# Patient Record
Sex: Male | Born: 1975 | Race: White | Hispanic: No | Marital: Single | Smoking: Never smoker
Health system: Southern US, Community
[De-identification: ages and names within clinical notes are randomized; demographics above are authoritative.]

## PROBLEM LIST (undated history)

## (undated) DIAGNOSIS — T4145XA Adverse effect of unspecified anesthetic, initial encounter: Secondary | ICD-10-CM

## (undated) DIAGNOSIS — J189 Pneumonia, unspecified organism: Secondary | ICD-10-CM

## (undated) DIAGNOSIS — T8859XA Other complications of anesthesia, initial encounter: Secondary | ICD-10-CM

## (undated) DIAGNOSIS — F419 Anxiety disorder, unspecified: Secondary | ICD-10-CM

## (undated) DIAGNOSIS — Z9889 Other specified postprocedural states: Secondary | ICD-10-CM

## (undated) DIAGNOSIS — E119 Type 2 diabetes mellitus without complications: Secondary | ICD-10-CM

## (undated) DIAGNOSIS — R112 Nausea with vomiting, unspecified: Secondary | ICD-10-CM

## (undated) DIAGNOSIS — M1990 Unspecified osteoarthritis, unspecified site: Secondary | ICD-10-CM

## (undated) HISTORY — PX: APPENDECTOMY: SHX54

## (undated) HISTORY — PX: KNEE ARTHROSCOPY: SUR90

## (undated) HISTORY — PX: WISDOM TOOTH EXTRACTION: SHX21

---

## 2008-07-06 DIAGNOSIS — J189 Pneumonia, unspecified organism: Secondary | ICD-10-CM

## 2008-07-06 HISTORY — DX: Pneumonia, unspecified organism: J18.9

## 2013-10-17 ENCOUNTER — Encounter (HOSPITAL_COMMUNITY): Payer: Self-pay | Admitting: Pharmacy Technician

## 2013-10-20 ENCOUNTER — Other Ambulatory Visit (HOSPITAL_COMMUNITY): Payer: Self-pay | Admitting: *Deleted

## 2013-10-20 ENCOUNTER — Encounter (HOSPITAL_COMMUNITY)
Admission: RE | Admit: 2013-10-20 | Discharge: 2013-10-20 | Disposition: A | Discharge status: Home / Self Care | Payer: No Typology Code available for payment source | Source: Ambulatory Visit | Attending: Orthopedic Surgery | Admitting: Orthopedic Surgery

## 2013-10-20 ENCOUNTER — Encounter (HOSPITAL_COMMUNITY): Payer: Self-pay

## 2013-10-20 DIAGNOSIS — Z01812 Encounter for preprocedural laboratory examination: Secondary | ICD-10-CM | POA: Insufficient documentation

## 2013-10-20 HISTORY — DX: Adverse effect of unspecified anesthetic, initial encounter: T41.45XA

## 2013-10-20 HISTORY — DX: Pneumonia, unspecified organism: J18.9

## 2013-10-20 HISTORY — DX: Other specified postprocedural states: Z98.890

## 2013-10-20 HISTORY — DX: Nausea with vomiting, unspecified: R11.2

## 2013-10-20 HISTORY — DX: Other complications of anesthesia, initial encounter: T88.59XA

## 2013-10-20 HISTORY — DX: Unspecified osteoarthritis, unspecified site: M19.90

## 2013-10-20 HISTORY — DX: Anxiety disorder, unspecified: F41.9

## 2013-10-20 LAB — COMPREHENSIVE METABOLIC PANEL
ALK PHOS: 104 U/L (ref 39–117)
ALT: 62 U/L — ABNORMAL HIGH (ref 0–53)
AST: 43 U/L — AB (ref 0–37)
Albumin: 4 g/dL (ref 3.5–5.2)
BUN: 17 mg/dL (ref 6–23)
CHLORIDE: 105 meq/L (ref 96–112)
CO2: 22 meq/L (ref 19–32)
CREATININE: 1.04 mg/dL (ref 0.50–1.35)
Calcium: 9.3 mg/dL (ref 8.4–10.5)
GFR calc Af Amer: >90 mL/min (ref >90)
GFR, EST NON AFRICAN AMERICAN: 90 mL/min — AB (ref >90)
Glucose, Bld: 115 mg/dL — ABNORMAL HIGH (ref 70–99)
POTASSIUM: 4.4 meq/L (ref 3.7–5.3)
Sodium: 141 mEq/L (ref 137–147)
Total Bilirubin: 0.6 mg/dL (ref 0.3–1.2)
Total Protein: 7.2 g/dL (ref 6.0–8.3)

## 2013-10-20 LAB — CBC WITH DIFFERENTIAL/PLATELET
Basophils Absolute: 0 10*3/uL (ref 0.0–0.1)
Basophils Relative: 0 % (ref 0–1)
Eosinophils Absolute: 0.2 10*3/uL (ref 0.0–0.7)
Eosinophils Relative: 4 % (ref 0–5)
HEMATOCRIT: 49.1 % (ref 39.0–52.0)
HEMOGLOBIN: 16.8 g/dL (ref 13.0–17.0)
Lymphocytes Relative: 36 % (ref 12–46)
Lymphs Abs: 1.9 10*3/uL (ref 0.7–4.0)
MCH: 30.3 pg (ref 26.0–34.0)
MCHC: 34.2 g/dL (ref 30.0–36.0)
MCV: 88.6 fL (ref 78.0–100.0)
MONOS PCT: 5 % (ref 3–12)
Monocytes Absolute: 0.3 10*3/uL (ref 0.1–1.0)
NEUTROS ABS: 2.8 10*3/uL (ref 1.7–7.7)
NEUTROS PCT: 55 % (ref 43–77)
Platelets: 248 10*3/uL (ref 150–400)
RBC: 5.54 MIL/uL (ref 4.22–5.81)
RDW: 12.8 % (ref 11.5–15.5)
WBC: 5.2 10*3/uL (ref 4.0–10.5)

## 2013-10-20 LAB — PROTIME-INR
INR: 0.98 (ref 0.00–1.49)
Prothrombin Time: 12.8 seconds (ref 11.6–15.2)

## 2013-10-20 LAB — APTT: APTT: 28 s (ref 24–37)

## 2013-10-20 NOTE — Pre-Procedure Instructions (Signed)
Maurice Gaines  10/20/2013   Your procedure is scheduled on:  Thursday, October 26, 2013 at 10:00 AM.   Report to [facility] En[facility]" Admitting Office at 8:00 AM.   Call this number if you have problems the morning of surgery: [phone]   Remember:   Do not eat food or drink liquids after midnight Wednesday, 10/25/13.   Take these medicines the morning of surgery with A SIP OF WATER: None    Do not wear jewelry.  Do not wear lotions, powders, or cologne. You may NOT wear deodorant.  Men may shave face and neck.  Do not bring valuables to the hospital.  [facility] is not responsible                  for any belongings or valuables.               Contacts, dentures or bridgework may not be worn into surgery.  Leave suitcase in the car. After surgery it may be brought to your room.  For patients admitted to the hospital, discharge time is determined by your                treatment team.               Patients discharged the day of surgery will not be allowed to drive home.  Name and phone number of your driver: Family/friend   Special Instructions: [facility] - Preparing for Surgery  Before surgery, you can play an important role.  Because skin is not sterile, your skin needs to be as free of germs as possible.  You can reduce the number of germs on you skin by washing with CHG (chlorahexidine gluconate) soap before surgery.  CHG is an antiseptic cleaner which kills germs and bonds with the skin to continue killing germs even after washing.  Please DO NOT use if you have an allergy to CHG or antibacterial soaps.  If your skin becomes reddened/irritated stop using the CHG and inform your nurse when you arrive at Short Stay.  Do not shave (including legs and underarms) for at least 48 hours prior to the first CHG shower.  You may shave your face.  Please follow these instructions carefully:   1.  Shower with CHG Soap the night before surgery and the                                 morning of Surgery.  2.  If you choose to wash your hair, wash your hair first as usual with your       normal shampoo.  3.  After you shampoo, rinse your hair and body thoroughly to remove the                      Shampoo.  4.  Use CHG as you would any other liquid soap.  You can apply chg directly       to the skin and wash gently with scrungie or a clean washcloth.  5.  Apply the CHG Soap to your body ONLY FROM THE NECK DOWN.        Do not use on open wounds or open sores.  Avoid contact with your eyes, ears, mouth and genitals (private parts).  Wash genitals (private parts) with your normal soap.  6.  Wash thoroughly, paying special attention to the area  where your surgery        will be performed.  7.  Thoroughly rinse your body with warm water from the neck down.  8.  DO NOT shower/wash with your normal soap after using and rinsing off       the CHG Soap.  9.  Pat yourself dry with a clean towel.            10.  Wear clean pajamas.            11.  Place clean sheets on your bed the night of your first shower and do not        sleep with pets.  Day of Surgery  Do not apply any lotions/deodorants the morning of surgery.  Please wear clean clothes to the hospital/surgery center.     Please read over the following fact sheets that you were given: Pain Booklet, Coughing and Deep Breathing and Surgical Site Infection Prevention

## 2013-10-20 NOTE — Progress Notes (Signed)
Pt does not have a PCP. 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

## 2013-10-25 MED ORDER — CEFAZOLIN SODIUM-DEXTROSE 2-3 GM-% IV SOLR
2.0000 g | INTRAVENOUS | Status: AC
  Administered 2013-10-26: 2 g via INTRAVENOUS
  Filled 2013-10-25: qty 50

## 2013-10-25 MED ORDER — CHLORHEXIDINE GLUCONATE 4 % EX LIQD
60.0000 mL | Freq: Once | CUTANEOUS | Status: DC
  Filled 2013-10-25: qty 60

## 2013-10-25 MED ORDER — LACTATED RINGERS IV SOLN
INTRAVENOUS | Status: DC
  Administered 2013-10-26: 09:00:00 via INTRAVENOUS

## 2013-10-26 ENCOUNTER — Encounter (HOSPITAL_COMMUNITY)
Admission: RE | Disposition: A | Discharge status: Home / Self Care | Payer: Self-pay | Source: Ambulatory Visit | Attending: Orthopedic Surgery

## 2013-10-26 ENCOUNTER — Ambulatory Visit (HOSPITAL_COMMUNITY): Payer: No Typology Code available for payment source | Admitting: Anesthesiology

## 2013-10-26 ENCOUNTER — Encounter (HOSPITAL_COMMUNITY): Payer: No Typology Code available for payment source | Admitting: Anesthesiology

## 2013-10-26 ENCOUNTER — Ambulatory Visit (HOSPITAL_COMMUNITY)
Admission: RE | Admit: 2013-10-26 | Discharge: 2013-10-26 | Disposition: A | Discharge status: Home / Self Care | Payer: No Typology Code available for payment source | Source: Ambulatory Visit | Attending: Orthopedic Surgery | Admitting: Orthopedic Surgery

## 2013-10-26 ENCOUNTER — Encounter (HOSPITAL_COMMUNITY): Payer: Self-pay | Admitting: Anesthesiology

## 2013-10-26 DIAGNOSIS — M19019 Primary osteoarthritis, unspecified shoulder: Secondary | ICD-10-CM | POA: Insufficient documentation

## 2013-10-26 DIAGNOSIS — M25819 Other specified joint disorders, unspecified shoulder: Secondary | ICD-10-CM | POA: Insufficient documentation

## 2013-10-26 DIAGNOSIS — M7580 Other shoulder lesions, unspecified shoulder: Secondary | ICD-10-CM

## 2013-10-26 DIAGNOSIS — S43439A Superior glenoid labrum lesion of unspecified shoulder, initial encounter: Secondary | ICD-10-CM | POA: Insufficient documentation

## 2013-10-26 DIAGNOSIS — X58XXXS Exposure to other specified factors, sequela: Secondary | ICD-10-CM | POA: Insufficient documentation

## 2013-10-26 HISTORY — PX: SHOULDER ARTHROSCOPY: SHX128

## 2013-10-26 LAB — GLUCOSE, CAPILLARY: GLUCOSE-CAPILLARY: 116 mg/dL — AB (ref 70–99)

## 2013-10-26 SURGERY — ARTHROSCOPY, SHOULDER
Anesthesia: Regional | Site: Shoulder | Laterality: Right

## 2013-10-26 MED ORDER — GLYCOPYRROLATE 0.2 MG/ML IJ SOLN
INTRAMUSCULAR | Status: AC
  Filled 2013-10-26: qty 2

## 2013-10-26 MED ORDER — NEOSTIGMINE METHYLSULFATE 1 MG/ML IJ SOLN
INTRAMUSCULAR | Status: AC
  Filled 2013-10-26: qty 10

## 2013-10-26 MED ORDER — LIDOCAINE HCL (CARDIAC) 20 MG/ML IV SOLN
INTRAVENOUS | Status: AC
  Filled 2013-10-26: qty 5

## 2013-10-26 MED ORDER — ONDANSETRON HCL 4 MG PO TABS: 4.0000 mg | ORAL_TABLET | Freq: Three times a day (TID) | ORAL | Status: AC | PRN

## 2013-10-26 MED ORDER — MIDAZOLAM HCL 2 MG/2ML IJ SOLN
1.0000 mg | INTRAMUSCULAR | Status: DC | PRN
  Administered 2013-10-26: 2 mg via INTRAVENOUS

## 2013-10-26 MED ORDER — ROCURONIUM BROMIDE 50 MG/5ML IV SOLN
INTRAVENOUS | Status: AC
  Filled 2013-10-26: qty 1

## 2013-10-26 MED ORDER — ROCURONIUM BROMIDE 100 MG/10ML IV SOLN
INTRAVENOUS | Status: DC | PRN
  Administered 2013-10-26: 50 mg via INTRAVENOUS

## 2013-10-26 MED ORDER — FENTANYL CITRATE 0.05 MG/ML IJ SOLN: 25.0000 ug | INTRAMUSCULAR | Status: DC | PRN

## 2013-10-26 MED ORDER — SCOPOLAMINE 1 MG/3DAYS TD PT72
MEDICATED_PATCH | TRANSDERMAL | Status: AC
  Filled 2013-10-26: qty 1

## 2013-10-26 MED ORDER — ACETAMINOPHEN 160 MG/5ML PO SOLN
325.0000 mg | ORAL | Status: DC | PRN
  Filled 2013-10-26: qty 20.3

## 2013-10-26 MED ORDER — GLYCOPYRROLATE 0.2 MG/ML IJ SOLN
INTRAMUSCULAR | Status: DC | PRN
  Administered 2013-10-26: 0.4 mg via INTRAVENOUS

## 2013-10-26 MED ORDER — FENTANYL CITRATE 0.05 MG/ML IJ SOLN
INTRAMUSCULAR | Status: AC
  Administered 2013-10-26: 50 ug via INTRAVENOUS
  Filled 2013-10-26: qty 2

## 2013-10-26 MED ORDER — MIDAZOLAM HCL 2 MG/2ML IJ SOLN
INTRAMUSCULAR | Status: AC
  Administered 2013-10-26: 2 mg via INTRAVENOUS
  Filled 2013-10-26: qty 2

## 2013-10-26 MED ORDER — NAPROXEN 500 MG PO TABS: 500.0000 mg | ORAL_TABLET | Freq: Two times a day (BID) | ORAL | Status: AC

## 2013-10-26 MED ORDER — PROPOFOL 10 MG/ML IV BOLUS
INTRAVENOUS | Status: AC
  Filled 2013-10-26: qty 20

## 2013-10-26 MED ORDER — ONDANSETRON HCL 4 MG/2ML IJ SOLN
INTRAMUSCULAR | Status: AC
  Filled 2013-10-26: qty 2

## 2013-10-26 MED ORDER — 0.9 % SODIUM CHLORIDE (POUR BTL) OPTIME
TOPICAL | Status: DC | PRN
  Administered 2013-10-26: 1000 mL

## 2013-10-26 MED ORDER — PROMETHAZINE HCL 25 MG/ML IJ SOLN
6.2500 mg | INTRAMUSCULAR | Status: DC | PRN
  Administered 2013-10-26: 6.25 mg via INTRAVENOUS

## 2013-10-26 MED ORDER — DIAZEPAM 5 MG PO TABS: 2.5000 mg | ORAL_TABLET | Freq: Four times a day (QID) | ORAL | Status: AC | PRN

## 2013-10-26 MED ORDER — OXYCODONE HCL 5 MG PO TABS: 5.0000 mg | ORAL_TABLET | Freq: Once | ORAL | Status: DC | PRN

## 2013-10-26 MED ORDER — FENTANYL CITRATE 0.05 MG/ML IJ SOLN
INTRAMUSCULAR | Status: AC
  Filled 2013-10-26: qty 5

## 2013-10-26 MED ORDER — OXYCODONE-ACETAMINOPHEN 5-325 MG PO TABS: 1.0000 | ORAL_TABLET | ORAL | Status: AC | PRN

## 2013-10-26 MED ORDER — ACETAMINOPHEN 325 MG PO TABS: 325.0000 mg | ORAL_TABLET | ORAL | Status: DC | PRN

## 2013-10-26 MED ORDER — OXYCODONE HCL 5 MG/5ML PO SOLN: 5.0000 mg | Freq: Once | ORAL | Status: DC | PRN

## 2013-10-26 MED ORDER — ONDANSETRON HCL 4 MG/2ML IJ SOLN
INTRAMUSCULAR | Status: DC | PRN
  Administered 2013-10-26: 4 mg via INTRAVENOUS

## 2013-10-26 MED ORDER — NEOSTIGMINE METHYLSULFATE 1 MG/ML IJ SOLN
INTRAMUSCULAR | Status: DC | PRN
  Administered 2013-10-26: 3 mg via INTRAVENOUS

## 2013-10-26 MED ORDER — SCOPOLAMINE 1 MG/3DAYS TD PT72
MEDICATED_PATCH | TRANSDERMAL | Status: DC | PRN
  Administered 2013-10-26: 1 via TRANSDERMAL

## 2013-10-26 MED ORDER — SODIUM CHLORIDE 0.9 % IR SOLN
Status: DC | PRN
  Administered 2013-10-26 (×2): 3000 mL

## 2013-10-26 MED ORDER — ROPIVACAINE HCL 5 MG/ML IJ SOLN
INTRAMUSCULAR | Status: DC | PRN
Start: 2013-10-26 — End: 2013-10-26
  Administered 2013-10-26: 30 mL via PERINEURAL

## 2013-10-26 MED ORDER — PROPOFOL 10 MG/ML IV BOLUS
INTRAVENOUS | Status: DC | PRN
  Administered 2013-10-26: 200 mg via INTRAVENOUS
  Administered 2013-10-26: 100 mg via INTRAVENOUS

## 2013-10-26 MED ORDER — FENTANYL CITRATE 0.05 MG/ML IJ SOLN
50.0000 ug | INTRAMUSCULAR | Status: DC | PRN
  Administered 2013-10-26: 50 ug via INTRAVENOUS

## 2013-10-26 MED ORDER — MIDAZOLAM HCL 2 MG/2ML IJ SOLN
INTRAMUSCULAR | Status: AC
  Filled 2013-10-26: qty 2

## 2013-10-26 MED ORDER — LIDOCAINE HCL (CARDIAC) 20 MG/ML IV SOLN
INTRAVENOUS | Status: DC | PRN
  Administered 2013-10-26: 80 mg via INTRAVENOUS

## 2013-10-26 MED ORDER — LACTATED RINGERS IV SOLN
INTRAVENOUS | Status: DC | PRN
  Administered 2013-10-26 (×2): via INTRAVENOUS

## 2013-10-26 MED ORDER — PROMETHAZINE HCL 25 MG/ML IJ SOLN
INTRAMUSCULAR | Status: AC
  Filled 2013-10-26: qty 1

## 2013-10-26 SURGICAL SUPPLY — 71 items
ANCHOR SUT SWIVELLOK BIO (Anchor) ×3 IMPLANT
BLADE CUTTER GATOR 3.5 (BLADE) ×3 IMPLANT
BLADE GREAT WHITE 4.2 (BLADE) ×2 IMPLANT
BLADE GREAT WHITE 4.2MM (BLADE) ×1
BLADE SURG 11 STRL SS (BLADE) ×3 IMPLANT
BOOTCOVER CLEANROOM LRG (PROTECTIVE WEAR) ×6 IMPLANT
BUR 3.5 LG SPHERICAL (BURR) IMPLANT
BUR OVAL 4.0 (BURR) ×3 IMPLANT
BURR 3.5 LG SPHERICAL (BURR)
BURR 3.5MM LG SPHERICAL (BURR)
CANISTER SUCT LVC 12 LTR MEDI- (MISCELLANEOUS) ×3 IMPLANT
CANNULA ACUFLEX KIT 5X76 (CANNULA) ×6 IMPLANT
CANNULA DRILOCK 5.0MMX75MM (CANNULA)
CANNULA DRILOCK 5.0X75 (CANNULA) IMPLANT
CLOSURE STERI-STRIP 1/2X4 (GAUZE/BANDAGES/DRESSINGS) ×1
CLOSURE WOUND 1/2 X4 (GAUZE/BANDAGES/DRESSINGS) ×1
CLSR STERI-STRIP ANTIMIC 1/2X4 (GAUZE/BANDAGES/DRESSINGS) ×2 IMPLANT
CONNECTOR 5 IN 1 STRAIGHT STRL (MISCELLANEOUS) ×3 IMPLANT
DRAPE INCISE 23X17 IOBAN STRL (DRAPES) ×2
DRAPE INCISE IOBAN 23X17 STRL (DRAPES) ×1 IMPLANT
DRAPE INCISE IOBAN 66X45 STRL (DRAPES) ×3 IMPLANT
DRAPE STERI 35X30 U-POUCH (DRAPES) ×3 IMPLANT
DRAPE SURG 17X11 SM STRL (DRAPES) ×3 IMPLANT
DRAPE U-SHAPE 47X51 STRL (DRAPES) ×3 IMPLANT
DRSG MEPILEX BORDER 4X4 (GAUZE/BANDAGES/DRESSINGS) ×3 IMPLANT
DRSG PAD ABDOMINAL 8X10 ST (GAUZE/BANDAGES/DRESSINGS) ×6 IMPLANT
DURAPREP 26ML APPLICATOR (WOUND CARE) ×3 IMPLANT
ELECT REM PT RETURN 9FT ADLT (ELECTROSURGICAL) ×3
ELECTRODE REM PT RTRN 9FT ADLT (ELECTROSURGICAL) ×1 IMPLANT
GLOVE BIO SURGEON STRL SZ7 (GLOVE) ×3 IMPLANT
GLOVE BIO SURGEON STRL SZ7.5 (GLOVE) ×3 IMPLANT
GLOVE BIO SURGEON STRL SZ8 (GLOVE) ×3 IMPLANT
GLOVE BIOGEL PI IND STRL 7.0 (GLOVE) ×1 IMPLANT
GLOVE BIOGEL PI INDICATOR 7.0 (GLOVE) ×2
GLOVE EUDERMIC 7 POWDERFREE (GLOVE) ×3 IMPLANT
GLOVE SS BIOGEL STRL SZ 7.5 (GLOVE) ×1 IMPLANT
GLOVE SUPERSENSE BIOGEL SZ 7.5 (GLOVE) ×2
GOWN STRL REUS W/ TWL LRG LVL3 (GOWN DISPOSABLE) ×2 IMPLANT
GOWN STRL REUS W/ TWL XL LVL3 (GOWN DISPOSABLE) ×4 IMPLANT
GOWN STRL REUS W/TWL LRG LVL3 (GOWN DISPOSABLE) ×4
GOWN STRL REUS W/TWL XL LVL3 (GOWN DISPOSABLE) ×8
KIT BASIN OR (CUSTOM PROCEDURE TRAY) ×3 IMPLANT
KIT BIO-TENODESIS 3X8 DISP (MISCELLANEOUS) ×2
KIT INSRT BABSR STRL DISP BTN (MISCELLANEOUS) ×1 IMPLANT
KIT ROOM TURNOVER OR (KITS) ×3 IMPLANT
KIT SHOULDER TRACTION (DRAPES) ×3 IMPLANT
MANIFOLD NEPTUNE II (INSTRUMENTS) ×3 IMPLANT
NDL SUT 6 .5 CRC .975X.05 MAYO (NEEDLE) IMPLANT
NEEDLE MAYO TAPER (NEEDLE)
NEEDLE SPNL 18GX3.5 QUINCKE PK (NEEDLE) ×3 IMPLANT
NS IRRIG 1000ML POUR BTL (IV SOLUTION) ×3 IMPLANT
PACK SHOULDER (CUSTOM PROCEDURE TRAY) ×3 IMPLANT
PAD ABD 8X10 STRL (GAUZE/BANDAGES/DRESSINGS) ×3 IMPLANT
PAD ARMBOARD 7.5X6 YLW CONV (MISCELLANEOUS) ×6 IMPLANT
SET ARTHROSCOPY TUBING (MISCELLANEOUS) ×2
SET ARTHROSCOPY TUBING LN (MISCELLANEOUS) ×1 IMPLANT
SLING ARM LRG ADULT FOAM STRAP (SOFTGOODS) IMPLANT
SLING ARM MED ADULT FOAM STRAP (SOFTGOODS) ×3 IMPLANT
SPONGE GAUZE 4X4 12PLY (GAUZE/BANDAGES/DRESSINGS) ×3 IMPLANT
SPONGE LAP 4X18 X RAY DECT (DISPOSABLE) ×3 IMPLANT
STRIP CLOSURE SKIN 1/2X4 (GAUZE/BANDAGES/DRESSINGS) ×2 IMPLANT
SUT MNCRL AB 3-0 PS2 18 (SUTURE) ×3 IMPLANT
SUT PDS AB 0 CT 36 (SUTURE) IMPLANT
SUT RETRIEVER GRASP 30 DEG (SUTURE) IMPLANT
SYR 20CC LL (SYRINGE) ×3 IMPLANT
TAPE PAPER 3X10 WHT MICROPORE (GAUZE/BANDAGES/DRESSINGS) ×3 IMPLANT
TOWEL OR 17X24 6PK STRL BLUE (TOWEL DISPOSABLE) ×3 IMPLANT
TOWEL OR 17X26 10 PK STRL BLUE (TOWEL DISPOSABLE) ×3 IMPLANT
WAND SUCTION MAX 4MM 90S (SURGICAL WAND) ×3 IMPLANT
WATER STERILE IRR 1000ML POUR (IV SOLUTION) ×3 IMPLANT
YANKAUER SUCT BULB TIP NO VENT (SUCTIONS) ×3 IMPLANT

## 2013-10-26 NOTE — H&P (Signed)
Maurice Gaines    Chief Complaint: RIGHT SOULDER SLAP TEAR HPI: The patient is a 38 y.o. male with chronic right shoulder pain refractory to conservative management  Past Medical History  Diagnosis Date  . Pneumonia 2010  . Anxiety     in crowds, never treated  . Arthritis     right shoulder  . Complication of anesthesia   . PONV (postoperative nausea and vomiting)     some nausea, no vomiting    Past Surgical History  Procedure Laterality Date  . Appendectomy    . Knee arthroscopy      x 2 (1st one was partial tear of meniscus) (2nd one complete tear)  . Wisdom tooth extraction      Family History  Problem Relation Age of Onset  . Other Mother   . Glaucoma Father     Social History:  reports that he has never smoked. He has never used smokeless tobacco. He reports that he drinks alcohol. He reports that he does not use illicit drugs.  Allergies: No Known Allergies  No prescriptions prior to admission     Physical Exam: right shoulder with painful and restricted motion as noted at recent office visits.  Vitals  Temp:  [97.6 F (36.4 C)] 97.6 F (36.4 C) (04/23 0750) Pulse Rate:  [59-74] 73 (04/23 0952) Resp:  [11-20] 20 (04/23 0952) BP: (115-144)/(80-98) 133/98 mmHg (04/23 0952) SpO2:  [98 %-100 %] 100 % (04/23 0952) Weight:  [87.091 kg (192 lb)] 87.091 kg (192 lb) (04/23 0751)  Assessment/Plan  Impression: RIGHT SOULDER SLAP TEAR  Plan of Action: Procedure(s): RIGHT ARTHROSCOPY SHOULDER DEBRIDEMENT VS REPAIR SUBACROMIAL DECOMPRESSION DISTAL CLAVICAL RESECTION POSSIBLE BICEP TENDODESIS  Maurice Gaines 10/26/2013, 9:57 AM

## 2013-10-26 NOTE — Anesthesia Postprocedure Evaluation (Signed)
  Anesthesia Post-op Note  Patient: Maurice Gaines  Procedure(s) Performed: Procedure(s): RIGHT ARTHROSCOPY SHOULDER DEBRIDEMENT VS REPAIR SUBACROMIAL DECOMPRESSION DISTAL CLAVICAL RESECTION POSSIBLE BICEP TENDODESIS (Right)  Patient Location: PACU  Anesthesia Type:General and Regional  Level of Consciousness: awake and alert   Airway and Oxygen Therapy: Patient Spontanous Breathing  Post-op Pain: none  Post-op Assessment: Post-op Vital signs reviewed, Patient's Cardiovascular Status Stable, Respiratory Function Stable, Patent Airway, No signs of Nausea or vomiting and Pain level controlled  Post-op Vital Signs: Reviewed and stable  Last Vitals:  Filed Vitals:   10/26/13 1418  BP: 113/73  Pulse: 49  Temp:   Resp: 15    Complications: No apparent anesthesia complications

## 2013-10-26 NOTE — Progress Notes (Signed)
Pt remains slightly pale but reports that he is feeling better.

## 2013-10-26 NOTE — Op Note (Signed)
10/26/2013  12:35 PM  PATIENT:   Ezzie DuralAnthony Brendlinger  38 y.o. male  PRE-OPERATIVE DIAGNOSIS:  RIGHT SOULDER SLAP TEAR,IMPINGMENT, AC JOINT OA  POST-OPERATIVE DIAGNOSIS:  same  PROCEDURE:  RSA, SAD, DCR, open supra pectoral bicep tenodesis  SURGEON:  Ellamarie Naeve, Vania ReaKevin M. M.D.  ASSISTANTS: Shuford pac   ANESTHESIA:   GET + ISB  EBL: min  SPECIMEN:  none  Drains: none   PATIENT DISPOSITION:  PACU - hemodynamically stable.    PLAN OF CARE: Discharge to home after PACU  Dictation# [id number]

## 2013-10-26 NOTE — Discharge Instructions (Signed)
° °  Maurice ReaKevin M. Supple, M.D., F.A.A.O.S. Orthopaedic Surgery Specializing in Arthroscopic and Reconstructive Surgery of the Shoulder and Knee [phone] [address] [place], [place] [id number] - Fax [phone]   POST-OP SHOULDER ARTHROSCOPY INSTRUCTIONS  1. Call the office at [phone] to schedule your first post-op appointment 7-10 days from the date of your surgery.  2. Leave the steri-strips in place over your incisions when performing dressing changes and showering. You may remove your dressings and begin showering 72 hours from surgery. You can expect drainage that is clear to bloody in nature that occasionally will soak through your dressings. If this occurs go ahead and perform a dressing change. The drainage should lessen daily and when there is no drainage from your incisions feel free to go without a dressing.  3. Wear your sling for comfort. You may come out of your sling for ad lib activity and even decide not to use the sling at all. If you find you are more comfortable in your sling, make sure you come out of your sling at least 3-4 times a day to do the exercises that are included below.  4. Range of motion to your elbow is ok in flexion but no full extension (limit from 45-90),ok to move wrist, and hand and is encouraged 3-5 times daily. Exercise to your hand and fingers helps to reduce swelling you may experience.  5. Utilize ice to the shoulder 3-4 times minimum a day and additionally if you are experiencing pain.  6. You may drive when safely off narcotics and muscle relaxants.  7. If you had a block pre-operatively to provide post-op pain relief you may want to go ahead and begin utilizing your pain meds as your arm begins to wake up. Blocks can sometimes last up to 16-18 hours. If you are still pain-free prior to going to bed you may want to strongly consider taking a pain medication to avoid being awakened in the night with the onset of pain. A muscle  relaxant is also provided for you should you experience muscle spasms. It is recommended that if you are experiencing pain that your pain medication alone is not controlling, add the muscle relaxant along with the pain medication which can give additional pain relief. The first one to two days is generally the most severe of your pain and then should gradually decrease. As your pain lessens it is recommended that you decrease your use of the pain medications to an "as needed basis" only and to always comply with the recommended dosages of the pain medications.  8. Pain medications can produce constipation along with their use. If you experience this, the use of an over the counter stool softener or laxative daily is recommended.   9. For additional questions or concerns, please do not hesitate to call the office. If after hours there is an answering service to forward your concerns to the physician on call.   POST-OP EXERCISES   *****PERFORM PENDULUMS WITH ELBOW BENT!!!!!*******  The pendulum exercises should be performed while bending at the waist as far over as possible thereby letting gravity do the work for you.  Range of Motion Exercises: Pendulum (circular)  Repeat 20 times. Do 3 sessions per day.     Range of Motion Exercises: Pendulum (side-to-side)  Repeat 20 times. Do 3 sessions per day.

## 2013-10-26 NOTE — Transfer of Care (Signed)
Immediate Anesthesia Transfer of Care Note  Patient: Maurice Gaines  Procedure(s) Performed: Procedure(s): RIGHT ARTHROSCOPY SHOULDER DEBRIDEMENT VS REPAIR SUBACROMIAL DECOMPRESSION DISTAL CLAVICAL RESECTION POSSIBLE BICEP TENDODESIS (Right)  Patient Location: PACU  Anesthesia Type:GA combined with regional for post-op pain  Level of Consciousness: awake, alert , oriented and patient cooperative  Airway & Oxygen Therapy: Patient Spontanous Breathing and Patient connected to nasal cannula oxygen  Post-op Assessment: Report given to PACU RN and Post -op Vital signs reviewed and stable  Post vital signs: Reviewed and stable  Complications: No apparent anesthesia complications

## 2013-10-26 NOTE — Anesthesia Procedure Notes (Addendum)
Anesthesia Regional Block:  Interscalene brachial plexus block  Pre-Anesthetic Checklist: ,, timeout performed, Correct Patient, Correct Site, Correct Laterality, Correct Procedure, Correct Position, site marked, Risks and benefits discussed,  Surgical consent,  Pre-op evaluation,  At surgeon's request and post-op pain management  Laterality: Right and Upper  Prep: chloraprep       Needles:  Injection technique: Single-shot  Needle Type: Echogenic Stimulator Needle          Additional Needles:  Procedures: ultrasound guided (picture in chart) and nerve stimulator Interscalene brachial plexus block  Nerve Stimulator or Paresthesia:  Response: deltoid, 0.4 mA,   Additional Responses:   Narrative:  Start time: 10/26/2013 9:23 AM End time: 10/26/2013 9:28 AM Injection made incrementally with aspirations every 5 mL.  Performed by: Personally  Anesthesiologist: Tahlor Berenguer  Additional Notes: H+P and labs reviewed, risks and benefits discussed with patient, procedure tolerated well without complications

## 2013-10-26 NOTE — Anesthesia Preprocedure Evaluation (Signed)
Anesthesia Evaluation  Patient identified by MRN, date of birth, ID band Patient awake    Reviewed: Allergy & Precautions, H&P , NPO status , Patient's Chart, lab work & pertinent test results  History of Anesthesia Complications (+) PONV and history of anesthetic complications  Airway Mallampati: II TM Distance: >3 FB Neck ROM: Full    Dental  (+) Teeth Intact,    Pulmonary neg sleep apnea, neg COPD breath sounds clear to auscultation        Cardiovascular negative cardio ROS  Rhythm:Regular     Neuro/Psych negative neurological ROS  negative psych ROS   GI/Hepatic negative GI ROS, Neg liver ROS,   Endo/Other  negative endocrine ROS  Renal/GU negative Renal ROS     Musculoskeletal   Abdominal   Peds  Hematology negative hematology ROS (+)   Anesthesia Other Findings   Reproductive/Obstetrics                           Anesthesia Physical Anesthesia Plan  ASA: I  Anesthesia Plan: General and Regional   Post-op Pain Management:    Induction: Intravenous  Airway Management Planned: Oral ETT  Additional Equipment: None  Intra-op Plan:   Post-operative Plan: Extubation in OR  Informed Consent: I have reviewed the patients History and Physical, chart, labs and discussed the procedure including the risks, benefits and alternatives for the proposed anesthesia with the patient or authorized representative who has indicated his/her understanding and acceptance.   Dental advisory given  Plan Discussed with: CRNA and Surgeon  Anesthesia Plan Comments:         Anesthesia Quick Evaluation

## 2013-10-26 NOTE — Progress Notes (Addendum)
Pt feeling light headed and nauseous, no emesis. Cool cloths placed on head and neck with some improvement, VSS, will monitor.

## 2013-10-27 NOTE — Op Note (Signed)
NAME:  Maurice Gaines, Maurice Gaines             ACCOUNT NO.:  [id number]  MEDICAL RECORD NO.:  [id number]  LOCATION:                               FACILITY:  [facility]  PHYSICIAN:  Vania ReaKevin M. Marg Macmaster, M.D.  DATE OF BIRTH:  12-May-1976  DATE OF PROCEDURE:  10/26/2013 DATE OF DISCHARGE:  10/26/2013                              OPERATIVE REPORT   PREOPERATIVE DIAGNOSIS:  Chronic right shoulder pain with impingement syndrome, as well as clinical and MRI evidence for both a superior labral tear with paralabral cyst as well as symptomatic acromioclavicular joint arthropathy.  POSTOPERATIVE DIAGNOSES: 1. Chronic right shoulder impingement. 2. 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 3. 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  PROCEDURE: 1. Right shoulder examination under anesthesia. 2. Right shoulder glenoid joint diagnostic arthroscopy. 3. Debridement of superior as well as posterior labral tears and     decompression of associated paralabral cyst. 4. Arthroscopic subacromial decompression bursectomy. 5. Arthroscopic distal clavicle resection. 6. Open suprapectoral biceps tendon tenodesis.  SURGEON:  Vania ReaKevin M. Morgin Halls, M.D.  ASSISTANArbutus Ped:  Trucy A. Shuford, PA-C.  ANESTHESIA:  General endotracheal as well as an interscalene block.  ESTIMATED BLOOD LOSS:  Minimal.  DRAINS:  None.  HISTORY:  Mr. Maurice Gaines is a 38 year old gentleman who has had persistent right shoulder pain after a sports-related injury.  He has had ongoing discomfort with heavy use of the right upper extremity and has a clinical examination suggestive of superior labral/proximal biceps pathology.  His plain radiographs [facility] and MRI scan shows evidence for a superior labral tear and paralabral cyst, consistent with an unstable superior labral injury.  Due to his ongoing pain and functional limitations, he  is brought to the operating room at this time for planned right shoulder arthroscopy as described below.  Preoperatively, I counseled Mr. Maurice Gaines on treatment options as well as risks versus benefits thereof.  Possible surgical complications were all reviewed including potential for bleeding, infection, neurovascular injury, persistent pain, loss of motion, anesthetic complication, and possible need for additional surgery.  He understands and accepts and agrees with our planned procedure.  PROCEDURE IN DETAIL:  After undergoing routine preop evaluation, patient received prophylactic antibiotics.  An interscalene block was established in the holding area by the Anesthesia Department.  Placed supine on the operating table, underwent smooth induction of a general endotracheal anesthesia.  Turned to a left lateral decubitus position on a beanbag and appropriately padded and protected.  The right shoulder examination under anesthesia revealed full motion.  No instability patterns noted.  Right arm was then suspended at 70 degrees abduction with 10 pounds of traction.  The right shoulder girdle region was sterilely prepped and draped in standard fashion.  Time-out was called. A posterior portal was established into the glenohumeral joint and an anterior portal was established under direct visualization.  Articular surfaces were found to be in good condition.  No instability patterns noted.  The rotator cuff was carefully inspected and found to be intact with no fraying or fibrillations.  The labrum showed an obvious large and unstable tear of the superior labrum  with marked instability to biceps anchor.  Given these findings, we performed a biceps tenotomy for later tenodesis.  We then used a combination of shaver and Stryker wand to remove the unstable aspect of the superior labrum back to stable margin and the labral tear was also noted to extend posteriorly and these areas were all  debrided in such a way as to decompress the associated paralabral cyst.  Once this was completed, hemostasis was obtained.  Fluid and instruments were removed.  The arm was dropped down to 30 degrees abduction with the arthroscope introduced into the subacromial space through the posterior portal and a direct lateral portal was established in the subacromial space.  Abundant dense bursal tissue and multiple adhesions were encountered and these were all divided and excised with combination of the shaver and Stryker wand. The wand was then used to remove the periosteum from the undersurface of the anterior half of the acromion.  The subacromial decompression was performed with a bur creating type 1 morphology.  Portal was then established directly anterior to the distal clavicle and distal clavicle resection was performed with a bur.  Care was taken to confirm visualization of the entire circumference of the distal clavicle to ensure adequate removal of bone.  We then completed the subacromial/subdeltoid bursectomy and hemostasis was obtained.  The bursal surface of rotator cuff was found to be intact.  At this point, fluid and instrument were removed.  At this point, the arthroscopic portion of the case was completed and made a 3-cm incision over the anterior aspect of the proximal upper arm over the distal aspect of the deltopectoral interval.  Skin flaps were elevated.  Dissection carried deeply.  The cephalic vein was identified and retracted laterally with the deltoid and the pec major was then mobilized and retracted medially and dissection carried down distally such that we were just above the pec major insertion and this allowed [place] to gain access to the anterior cortex of the humerus and the fascial plane overlying the long head biceps tendon.  This was split longitudinally to allow [place] to retrieve the long head of the biceps tendon.  We then passed a whipstitch through the biceps  tendon at the appropriate level while tensioning the biceps for the biceps tenodesis.  The residual proximal segment of the biceps was then excised.  A guide pin was then placed into the anterior cortex of the proximal humerus at the level of bicipital groove, and introduced unicortically and this was overdrilled with a 6.5 mm reamer.  We then used a 7 mm tap into the tunnel.  Used a 6.25 [employer] tenodesis screw, and this was used to then deliver the biceps tendon into the bone tunnel and the interference screw was then inserted with excellent fit and fixation.  The overlying suture limbs were then tied to one another to reinforce the repair and overall construct was much to our satisfaction with good fixation.  The wounds were irrigated.  Hemostasis was obtained.  The deltopectoral interval allowed to reapproximate.  A 2- 0 Vicryl used for the subcu layer and intracuticular 3-0 Monocryl for the skin followed by Dermabond.  The arthroscopy portals were closed with Steri-Strips.  Dry dressing taped over the right shoulder and upper arm.  The right arm was placed into a sling.  The patient was awakened, extubated, and taken to the recovery room in stable condition.  Ralene Bathe, PA-C was used an Geophysicist/field seismologist throughout this case, essential for help with positioning  of the patient, positioning of the extremity, management of the arthroscopic equipment, tissue manipulation, suture management, retraction, wound closure, and intraoperative decision making.     Vania ReaKevin M. Keino Placencia, M.D.     KMS/MEDQ  D:  10/26/2013  T:  10/27/2013  Job:  [id number]

## 2013-10-31 ENCOUNTER — Encounter (HOSPITAL_COMMUNITY): Payer: Self-pay | Admitting: Orthopedic Surgery

## 2019-07-06 ENCOUNTER — Emergency Department (HOSPITAL_COMMUNITY)
Admission: EM | Admit: 2019-07-06 | Discharge: 2019-07-06 | Disposition: A | Discharge status: Home / Self Care | Payer: No Typology Code available for payment source | Attending: Emergency Medicine | Admitting: Emergency Medicine

## 2019-07-06 ENCOUNTER — Emergency Department (HOSPITAL_COMMUNITY): Payer: Self-pay

## 2019-07-06 ENCOUNTER — Other Ambulatory Visit: Payer: Self-pay

## 2019-07-06 ENCOUNTER — Encounter (HOSPITAL_COMMUNITY): Payer: Self-pay | Admitting: Emergency Medicine

## 2019-07-06 DIAGNOSIS — Y92312 Tennis court as the place of occurrence of the external cause: Secondary | ICD-10-CM | POA: Insufficient documentation

## 2019-07-06 DIAGNOSIS — S82892A Other fracture of left lower leg, initial encounter for closed fracture: Secondary | ICD-10-CM

## 2019-07-06 DIAGNOSIS — Y999 Unspecified external cause status: Secondary | ICD-10-CM | POA: Insufficient documentation

## 2019-07-06 DIAGNOSIS — Y9373 Activity, racquet and hand sports: Secondary | ICD-10-CM | POA: Insufficient documentation

## 2019-07-06 DIAGNOSIS — S82442A Displaced spiral fracture of shaft of left fibula, initial encounter for closed fracture: Secondary | ICD-10-CM | POA: Insufficient documentation

## 2019-07-06 DIAGNOSIS — X501XXA Overexertion from prolonged static or awkward postures, initial encounter: Secondary | ICD-10-CM | POA: Insufficient documentation

## 2019-07-06 MED ORDER — FENTANYL CITRATE (PF) 100 MCG/2ML IJ SOLN
50.0000 ug | Freq: Once | INTRAMUSCULAR | Status: AC
  Administered 2019-07-06: 50 ug via INTRAVENOUS

## 2019-07-06 MED ORDER — LIDOCAINE-EPINEPHRINE (PF) 2 %-1:200000 IJ SOLN
INTRAMUSCULAR | Status: AC
  Filled 2019-07-06: qty 20

## 2019-07-06 MED ORDER — ONDANSETRON 4 MG PO TBDP: ORAL_TABLET | ORAL | 0 refills | Status: AC

## 2019-07-06 MED ORDER — LIDOCAINE-EPINEPHRINE (PF) 2 %-1:200000 IJ SOLN
10.0000 mL | Freq: Once | INTRAMUSCULAR | Status: AC
  Administered 2019-07-06: 10 mL via INTRADERMAL

## 2019-07-06 MED ORDER — FENTANYL CITRATE (PF) 100 MCG/2ML IJ SOLN
100.0000 ug | Freq: Once | INTRAMUSCULAR | Status: AC
  Administered 2019-07-06: 100 ug via INTRAVENOUS
  Filled 2019-07-06: qty 2

## 2019-07-06 MED ORDER — FENTANYL CITRATE (PF) 100 MCG/2ML IJ SOLN
INTRAMUSCULAR | Status: AC
  Filled 2019-07-06: qty 2

## 2019-07-06 MED ORDER — MORPHINE SULFATE 15 MG PO TABS: 15.0000 mg | ORAL_TABLET | ORAL | 0 refills | Status: AC | PRN

## 2019-07-06 NOTE — Discharge Instructions (Signed)

## 2019-07-06 NOTE — ED Triage Notes (Signed)
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 V/s on arrival 140/92, hr78, rr10, spo2 98 room air. Iv 18 LAC, 100 mcg fentyle and 4 zofran.  Pt is a physical therapist.

## 2019-07-06 NOTE — ED Provider Notes (Signed)
[facility]-EMERGENCY DEPT Provider Note   CSN: [id number] Arrival date & time: 07/06/19  1947     History Chief Complaint  Patient presents with  . Fall    Maurice Gaines is a 43 y.o. male.  43 yo M with a chief complaints of left ankle pain.  The patient was playing tennis and he fell and had an inversion injury to the left ankle.  Felt a pop.  Initially thought that he had ruptured his ACL, he has done this in the past.  And realized that he had pain and deformity to the left ankle.  He was able to straighten it out.  Denies any pain to the foot or the knee.  Denies other injury.  The history is provided by the patient.  Fall This is a new problem. The current episode started yesterday. The problem occurs constantly. The problem has not changed since onset.Pertinent negatives include no chest pain, no abdominal pain, no headaches and no shortness of breath. The symptoms are aggravated by bending and twisting. Nothing relieves the symptoms. He has tried nothing for the symptoms. The treatment provided no relief.       Past Medical History:  Diagnosis Date  . Anxiety    in crowds, never treated  . Arthritis    right shoulder  . Complication of anesthesia   . Pneumonia 2010  . PONV (postoperative nausea and vomiting)    some nausea, no vomiting    There are no problems to display for this patient.   Past Surgical History:  Procedure Laterality Date  . APPENDECTOMY    . KNEE ARTHROSCOPY     x 2 (1st one was partial tear of meniscus) (2nd one complete tear)  . SHOULDER ARTHROSCOPY Right 10/26/2013   Procedure: RIGHT ARTHROSCOPY SHOULDER DEBRIDEMENT VS REPAIR SUBACROMIAL DECOMPRESSION DISTAL CLAVICAL RESECTION POSSIBLE BICEP TENDODESIS;  Surgeon: Senaida LangeKevin M Supple, MD;  Location: [facility] OR;  Service: Orthopedics;  Laterality: Right;  . WISDOM TOOTH EXTRACTION         Family History  Problem Relation Age of Onset  . Other Mother   . Glaucoma Father      Social History   Tobacco Use  . Smoking status: Never Smoker  . Smokeless tobacco: Never Used  Substance Use Topics  . Alcohol use: Yes    Comment: very rare  . Drug use: No    Home Medications Prior to Admission medications   Medication Sig Start Date End Date Taking? Authorizing Provider  diazepam (VALIUM) 5 MG tablet Take 0.5-1 tablets (2.5-5 mg total) by mouth every 6 (six) hours as needed for muscle spasms or sedation. Patient not taking: Reported on 07/06/2019 10/26/13   Shuford, French Anaracy, PA-C  morphine (MSIR) 15 MG tablet Take 1 tablet (15 mg total) by mouth every 4 (four) hours as needed for severe pain. 07/06/19   Melene PlanFloyd, Ravan Schlemmer, DO  naproxen (NAPROSYN) 500 MG tablet Take 1 tablet (500 mg total) by mouth 2 (two) times daily with a meal. Patient not taking: Reported on 07/06/2019 10/26/13   Shuford, French Anaracy, PA-C  ondansetron (ZOFRAN ODT) 4 MG disintegrating tablet 4mg  ODT q4 hours prn nausea/vomit 07/06/19   Melene PlanFloyd, Zarion Oliff, DO  ondansetron (ZOFRAN) 4 MG tablet Take 1 tablet (4 mg total) by mouth every 8 (eight) hours as needed for nausea or vomiting. Patient not taking: Reported on 07/06/2019 10/26/13   Shuford, French Anaracy, PA-C  oxyCODONE-acetaminophen (PERCOCET) 5-325 MG per tablet Take 1-2 tablets by mouth every 4 (four)  hours as needed. Patient not taking: Reported on 07/06/2019 10/26/13   Shuford, French Ana, PA-C    Allergies    Patient has no known allergies.  Review of Systems   Review of Systems  Constitutional: Negative for chills and fever.  HENT: Negative for congestion and facial swelling.   Eyes: Negative for discharge and visual disturbance.  Respiratory: Negative for shortness of breath.   Cardiovascular: Negative for chest pain and palpitations.  Gastrointestinal: Negative for abdominal pain, diarrhea and vomiting.  Musculoskeletal: Positive for arthralgias. Negative for myalgias.  Skin: Negative for color change and rash.  Neurological: Negative for tremors, syncope and  headaches.  Psychiatric/Behavioral: Negative for confusion and dysphoric mood.    Physical Exam Updated Vital Signs BP (!) 154/97   Pulse (!) 102   Temp 98.6 F (37 C) (Oral)   Resp 16   Ht 5\' 7"  (1.702 m)   Wt 80.7 kg   SpO2 94%   BMI 27.88 kg/m   Physical Exam Vitals and nursing note reviewed.  Constitutional:      Appearance: He is well-developed.  HENT:     Head: Normocephalic and atraumatic.  Eyes:     Pupils: Pupils are equal, round, and reactive to light.  Neck:     Vascular: No JVD.  Cardiovascular:     Rate and Rhythm: Normal rate and regular rhythm.     Heart sounds: No murmur. No friction rub. No gallop.   Pulmonary:     Effort: No respiratory distress.     Breath sounds: No wheezing.  Abdominal:     General: There is no distension.     Tenderness: There is no abdominal tenderness. There is no guarding or rebound.  Musculoskeletal:        General: Swelling and signs of injury present.     Cervical back: Normal range of motion and neck supple.     Comments: Pain and swelling to the left ankle.  No pain at the proximal fibula.  Pulse motor and sensation are intact.  Skin:    Coloration: Skin is not pale.     Findings: No rash.  Neurological:     Mental Status: He is alert and oriented to person, place, and time.  Psychiatric:        Behavior: Behavior normal.     ED Results / Procedures / Treatments   Labs (all labs ordered are listed, but only abnormal results are displayed) Labs Reviewed - No data to display  EKG None  Radiology DG Ankle Complete Left  Result Date: 07/06/2019 CLINICAL DATA:  Left ankle fracture post reduction EXAM: LEFT ANKLE COMPLETE - 3+ VIEW COMPARISON:  Same day radiographs FINDINGS: Improved alignment of the ankle mortise status post reduction. Minimal widening of the medial clear space is improved from prior. Spiral fracture of the distal fibular metadiaphysis has also slightly improved in alignment. A 4 cm butterfly  fragment remains minimally posteriorly displaced. No significant displacement of a posterior malleolar fracture component. IMPRESSION: Improved alignment of the tibiotalar joint status post reduction. Electronically Signed   By: 07/08/2019 D.O.   On: 07/06/2019 22:01   DG Ankle Complete Left  Result Date: 07/06/2019 CLINICAL DATA:  Twisted left ankle while playing tennis today; pain all over the left ankle; EXAM: LEFT ANKLE COMPLETE - 3+ VIEW COMPARISON:  None. FINDINGS: There is a displaced spiral fracture of the distal left fibula. There is cortical regularity at the posterior aspect of the distal tibia, which could represent  fracture. There is dislocation of the tibia medially widening of the medial tibiotalar clear space measuring 1.0 cm in diameter. Pronounced regional soft tissue swelling. IMPRESSION: 1. Displaced spiral fracture of the distal left fibula. 2. There is cortical irregularity at the posterior aspect of the distal tibia, which could represent fracture. 3. Medial dislocation of the tibia. Electronically Signed   By: Emmaline Kluver M.D.   On: 07/06/2019 20:36    Procedures  SPLINT APPLICATION Date/Time: 10:21 PM Authorized by: Rae Roam Consent: Verbal consent obtained. Risks and benefits: risks, benefits and alternatives were discussed Consent given by: patient Splint applied by: orthopedic technician Location details: left ankle Splint type: posterior and stirrup Supplies used: orthoglass Post-procedure: The splinted body part was neurovascularly unchanged following the procedure. Patient tolerance: Patient tolerated the procedure well with no immediate complications.    .Joint Aspiration/Arthrocentesis  Date/Time: 07/06/2019 10:18 PM Performed by: Melene Plan, DO Authorized by: Melene Plan, DO   Consent:    Consent obtained:  Verbal   Consent given by:  Patient   Risks discussed:  Bleeding, infection, incomplete drainage and nerve damage    Alternatives discussed:  Delayed treatment, alternative treatment and no treatment Location:    Location:  Ankle   Ankle:  L ankle Anesthesia (see MAR for exact dosages):    Anesthesia method:  Local infiltration   Local anesthetic:  Lidocaine 2% WITH epi Procedure details:    Preparation: Patient was prepped and draped in usual sterile fashion     Needle gauge: 21.   Ultrasound guidance: no     Approach:  Medial   Aspirate amount:  0   Aspirate characteristics:  Bloody   Steroid injected: no     Specimen collected: no   Post-procedure details:    Dressing:  Adhesive bandage   Patient tolerance of procedure:  Tolerated well, no immediate complications Reduction of dislocation  Date/Time: 07/06/2019 10:19 PM Performed by: Melene Plan, DO Authorized by: Melene Plan, DO  Consent: Verbal consent obtained. Risks and benefits: risks, benefits and alternatives were discussed Consent given by: patient Patient understanding: patient states understanding of the procedure being performed Patient consent: the patient's understanding of the procedure matches consent given Procedure consent: procedure consent matches procedure scheduled Site marked: the operative site was marked Imaging studies: imaging studies available Required items: required blood products, implants, devices, and special equipment available Patient identity confirmed: verbally with patient Time out: Immediately prior to procedure a "time out" was called to verify the correct patient, procedure, equipment, support staff and site/side marked as required. Preparation: Patient was prepped and draped in the usual sterile fashion. Local anesthesia used: yes Anesthesia: local infiltration  Anesthesia: Local anesthesia used: yes Local Anesthetic: lidocaine 2% with epinephrine Anesthetic total: 10 mL  Sedation: Patient sedated: no  Patient tolerance: patient tolerated the procedure well with no immediate complications     (including critical care time)  Medications Ordered in ED Medications  lidocaine-EPINEPHrine (XYLOCAINE W/EPI) 2 %-1:200000 (PF) injection (  Not Given 07/06/19 2128)  lidocaine-EPINEPHrine (XYLOCAINE W/EPI) 2 %-1:200000 (PF) injection (  Not Given 07/06/19 2128)  fentaNYL (SUBLIMAZE) 100 MCG/2ML injection (has no administration in time range)  lidocaine-EPINEPHrine (XYLOCAINE W/EPI) 2 %-1:200000 (PF) injection 10 mL (10 mLs Intradermal Given by Other 07/06/19 2127)  fentaNYL (SUBLIMAZE) injection 100 mcg (100 mcg Intravenous Given 07/06/19 2127)  fentaNYL (SUBLIMAZE) injection 50 mcg (50 mcg Intravenous Given 07/06/19 2133)    ED Course  I have reviewed the triage vital signs  and the nursing notes.  Pertinent labs & imaging results that were available during my care of the patient were reviewed by me and considered in my medical decision making (see chart for details).    MDM Rules/Calculators/A&P                      43 yo M with a chief complaints of left ankle pain.  Patient had an inversion injury while playing tennis earlier today.  Obvious deformity on exam.  Will obtain a plain film.  Reassess.  Plain film with a spiral fracture of the distal fibula with lateral displacement of the talus.  This was reduced at bedside and splinted.  Significantly improved alignment.  Crutches.  Ortho follow-up.  10:21 PM:  I have discussed the diagnosis/risks/treatment options with the patient and believe the pt to be eligible for discharge home to follow-up with Ortho. We also discussed returning to the ED immediately if new or worsening sx occur. We discussed the sx which are most concerning (e.g., sudden worsening pain, fever, inability to tolerate by mouth) that necessitate immediate return. Medications administered to the patient during their visit and any new prescriptions provided to the patient are listed below.  Medications given during this visit Medications  lidocaine-EPINEPHrine  (XYLOCAINE W/EPI) 2 %-1:200000 (PF) injection (  Not Given 07/06/19 2128)  lidocaine-EPINEPHrine (XYLOCAINE W/EPI) 2 %-1:200000 (PF) injection (  Not Given 07/06/19 2128)  fentaNYL (SUBLIMAZE) 100 MCG/2ML injection (has no administration in time range)  lidocaine-EPINEPHrine (XYLOCAINE W/EPI) 2 %-1:200000 (PF) injection 10 mL (10 mLs Intradermal Given by Other 07/06/19 2127)  fentaNYL (SUBLIMAZE) injection 100 mcg (100 mcg Intravenous Given 07/06/19 2127)  fentaNYL (SUBLIMAZE) injection 50 mcg (50 mcg Intravenous Given 07/06/19 2133)     The patient appears reasonably screen and/or stabilized for discharge and I doubt any other medical condition or other [facility] requiring further screening, evaluation, or treatment in the ED at this time prior to discharge.   Final Clinical Impression(s) / ED Diagnoses Final diagnoses:  Closed fracture of left ankle, initial encounter    Rx / DC Orders ED Discharge Orders         Ordered    morphine (MSIR) 15 MG tablet  Every 4 hours PRN     07/06/19 2208    ondansetron (ZOFRAN ODT) 4 MG disintegrating tablet     07/06/19 2208           Deno Etienne, DO 07/06/19 2221

## 2019-12-19 ENCOUNTER — Encounter: Payer: Self-pay | Admitting: Nurse Practitioner

## 2020-01-24 ENCOUNTER — Ambulatory Visit: Payer: Self-pay | Admitting: Nurse Practitioner

## 2020-10-21 IMAGING — DX DG ANKLE COMPLETE 3+V*L*
3 series · 3 of 3 positions shown · non-contrast
Comparison: None.

CLINICAL DATA: Twisted left ankle while playing tennis today; pain
all over the left ankle;

EXAM:
LEFT ANKLE COMPLETE - 3+ VIEW

[ankle ap]
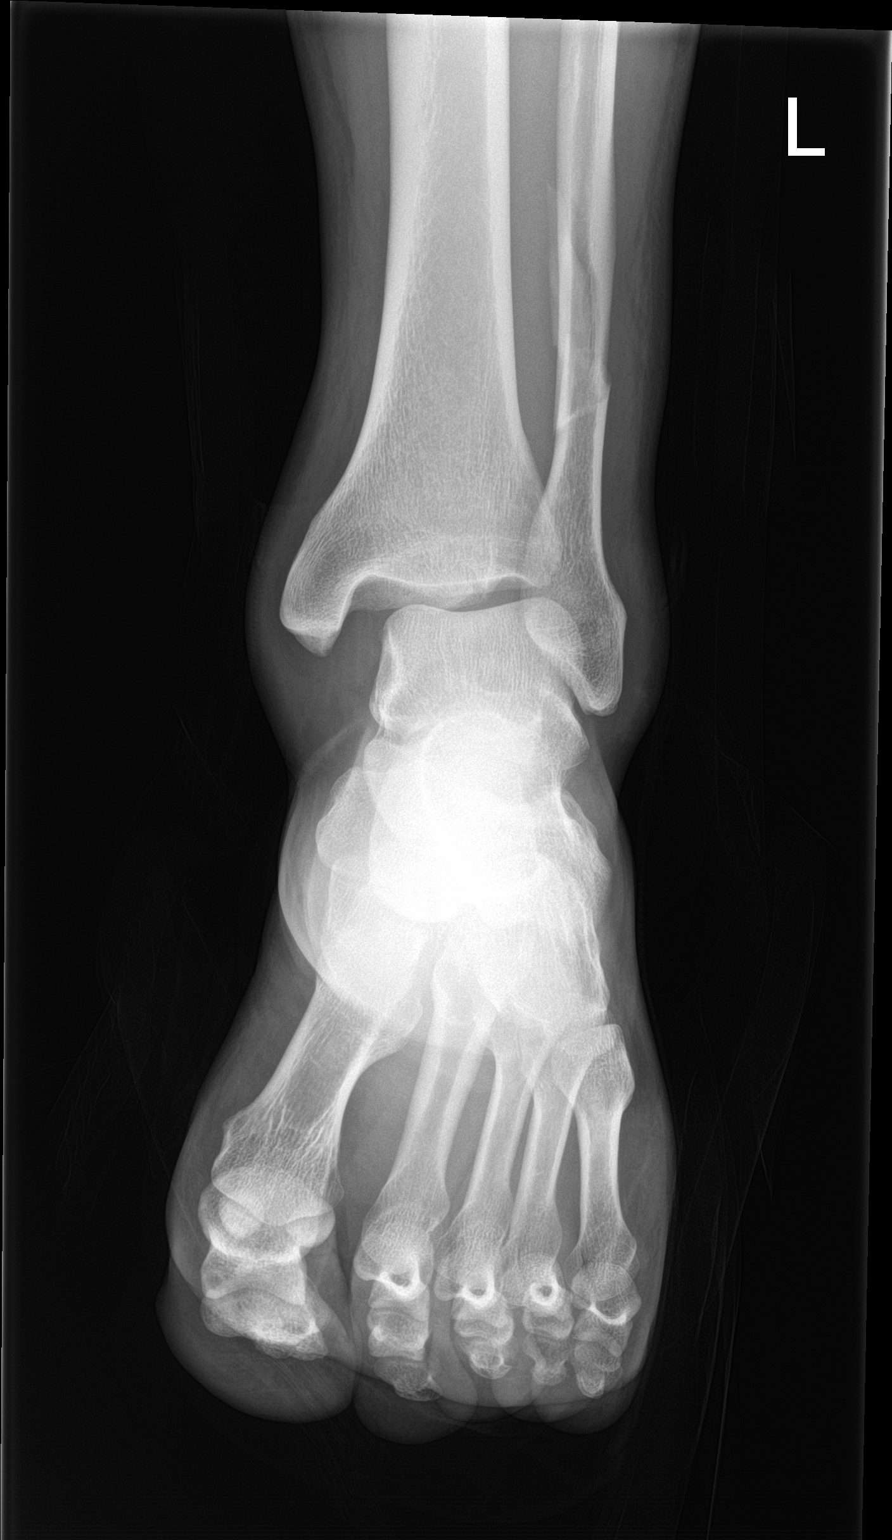

[ankle obl]
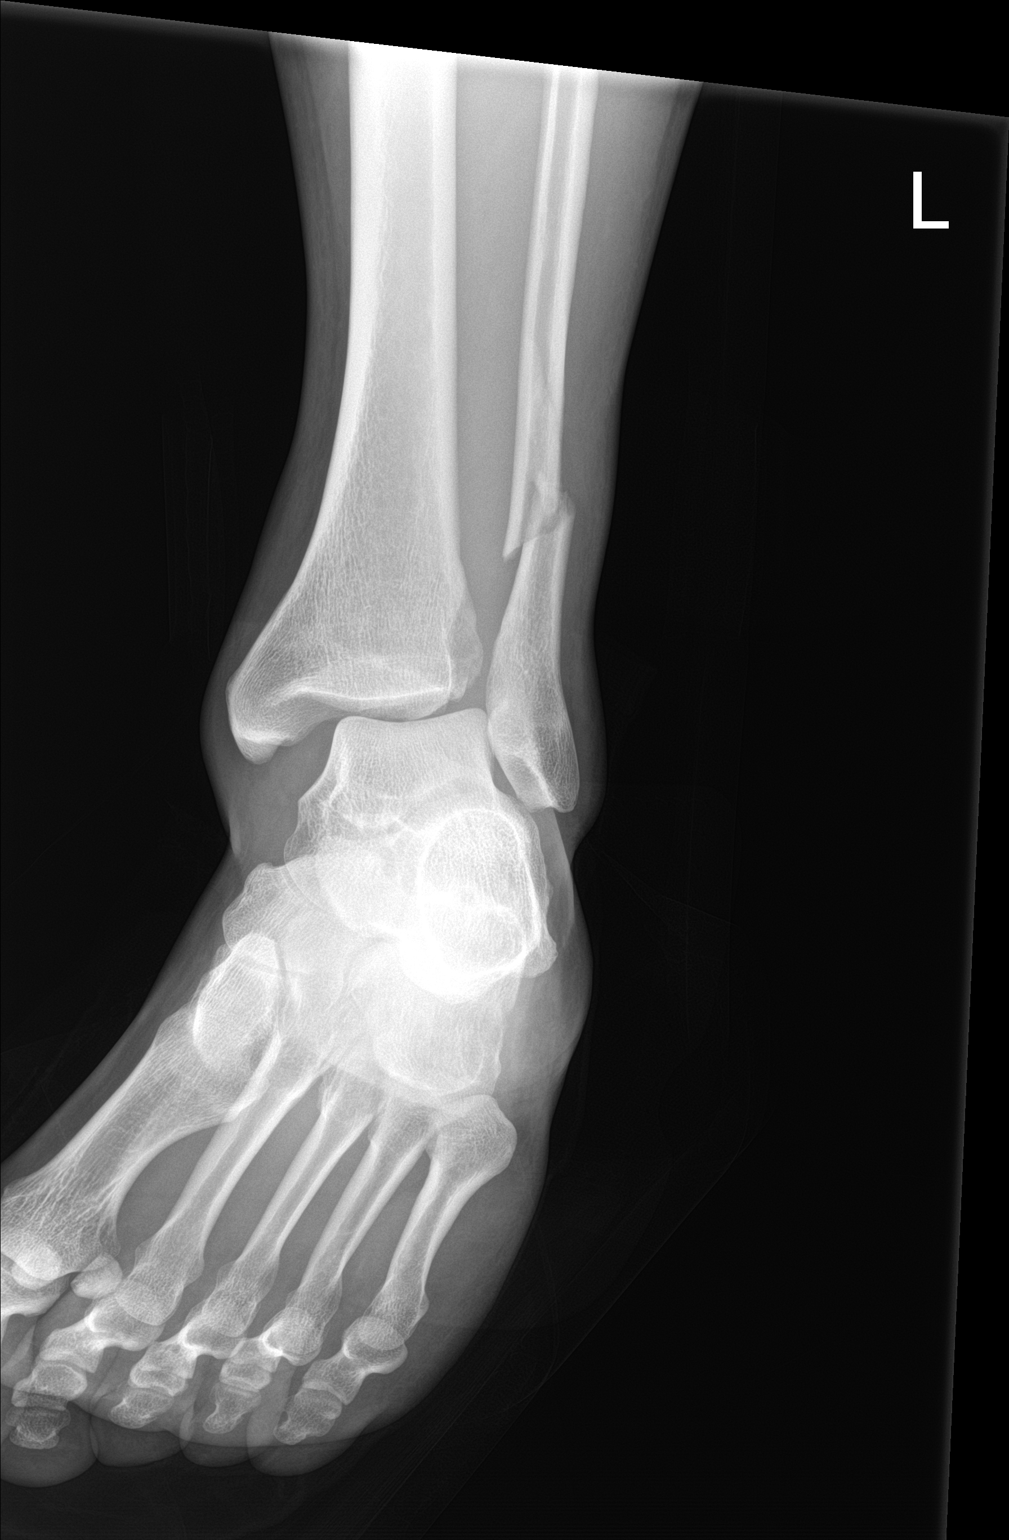

[ankle lat]
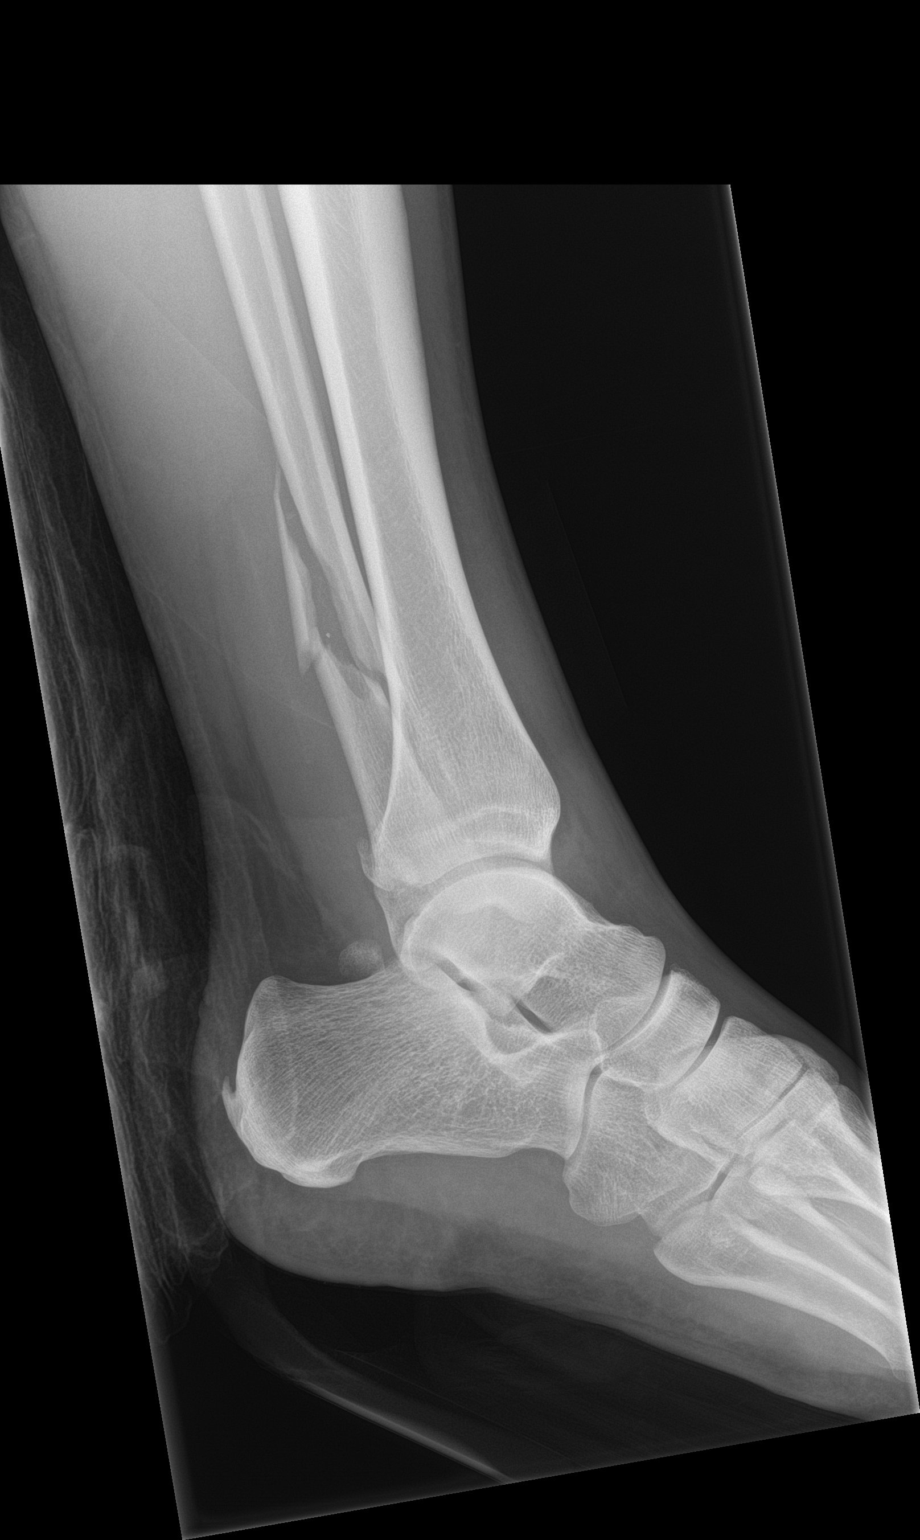

[3 of 3 positions shown; findings below may reference images not displayed]

FINDINGS: There is a displaced spiral fracture of the distal left fibula.
There is cortical regularity at the posterior aspect of the distal
tibia, which could represent fracture. There is dislocation of the
tibia medially widening of the medial tibiotalar clear space
measuring 1.0 cm in diameter. Pronounced regional soft tissue
swelling.
IMPRESSION: 1. Displaced spiral fracture of the distal left fibula.
2. There is cortical irregularity at the posterior aspect of the
distal tibia, which could represent fracture.
3. Medial dislocation of the tibia.

## 2022-06-09 ENCOUNTER — Emergency Department (HOSPITAL_COMMUNITY): Payer: Commercial Managed Care - PPO

## 2022-06-09 ENCOUNTER — Encounter (HOSPITAL_COMMUNITY): Payer: Self-pay

## 2022-06-09 ENCOUNTER — Emergency Department (HOSPITAL_COMMUNITY)
Admission: EM | Admit: 2022-06-09 | Discharge: 2022-06-09 | Discharge status: Against Medical Advice | Payer: Commercial Managed Care - PPO | Attending: Emergency Medicine | Admitting: Emergency Medicine

## 2022-06-09 ENCOUNTER — Other Ambulatory Visit: Payer: Self-pay

## 2022-06-09 DIAGNOSIS — Z5321 Procedure and treatment not carried out due to patient leaving prior to being seen by health care provider: Secondary | ICD-10-CM | POA: Diagnosis not present

## 2022-06-09 DIAGNOSIS — R112 Nausea with vomiting, unspecified: Secondary | ICD-10-CM | POA: Diagnosis not present

## 2022-06-09 DIAGNOSIS — R1032 Left lower quadrant pain: Secondary | ICD-10-CM | POA: Insufficient documentation

## 2022-06-09 HISTORY — DX: Type 2 diabetes mellitus without complications: E11.9

## 2022-06-09 LAB — URINALYSIS, ROUTINE W REFLEX MICROSCOPIC
Bilirubin Urine: NEGATIVE
Glucose, UA: NEGATIVE mg/dL
Ketones, ur: NEGATIVE mg/dL
Leukocytes,Ua: NEGATIVE
Nitrite: NEGATIVE
Protein, ur: NEGATIVE mg/dL
Specific Gravity, Urine: 1.018 (ref 1.005–1.030)
pH: 5 (ref 5.0–8.0)

## 2022-06-09 LAB — CBC WITH DIFFERENTIAL/PLATELET
Abs Immature Granulocytes: 0.01 10*3/uL (ref 0.00–0.07)
Basophils Absolute: 0 10*3/uL (ref 0.0–0.1)
Basophils Relative: 1 %
Eosinophils Absolute: 0.2 10*3/uL (ref 0.0–0.5)
Eosinophils Relative: 3 %
HCT: 44.8 % (ref 39.0–52.0)
Hemoglobin: 14.9 g/dL (ref 13.0–17.0)
Immature Granulocytes: 0 %
Lymphocytes Relative: 34 %
Lymphs Abs: 2.1 10*3/uL (ref 0.7–4.0)
MCH: 29.7 pg (ref 26.0–34.0)
MCHC: 33.3 g/dL (ref 30.0–36.0)
MCV: 89.2 fL (ref 80.0–100.0)
Monocytes Absolute: 0.3 10*3/uL (ref 0.1–1.0)
Monocytes Relative: 5 %
Neutro Abs: 3.4 10*3/uL (ref 1.7–7.7)
Neutrophils Relative %: 57 %
Platelets: 263 10*3/uL (ref 150–400)
RBC: 5.02 MIL/uL (ref 4.22–5.81)
RDW: 12.2 % (ref 11.5–15.5)
WBC: 6 10*3/uL (ref 4.0–10.5)
nRBC: 0 % (ref 0.0–0.2)

## 2022-06-09 LAB — COMPREHENSIVE METABOLIC PANEL
ALT: 32 U/L (ref 0–44)
AST: 23 U/L (ref 15–41)
Albumin: 4.1 g/dL (ref 3.5–5.0)
Alkaline Phosphatase: 57 U/L (ref 38–126)
Anion gap: 8 (ref 5–15)
BUN: 18 mg/dL (ref 6–20)
CO2: 25 mmol/L (ref 22–32)
Calcium: 9.1 mg/dL (ref 8.9–10.3)
Chloride: 106 mmol/L (ref 98–111)
Creatinine, Ser: 1.18 mg/dL (ref 0.61–1.24)
GFR, Estimated: >60 mL/min (ref >60)
Glucose, Bld: 217 mg/dL — ABNORMAL HIGH (ref 70–99)
Potassium: 4 mmol/L (ref 3.5–5.1)
Sodium: 139 mmol/L (ref 135–145)
Total Bilirubin: 0.9 mg/dL (ref 0.3–1.2)
Total Protein: 6.7 g/dL (ref 6.5–8.1)

## 2022-06-09 LAB — LIPASE, BLOOD: Lipase: 35 U/L (ref 11–51)

## 2022-06-09 MED ORDER — IOHEXOL 350 MG/ML SOLN
75.0000 mL | Freq: Once | INTRAVENOUS | Status: AC | PRN
  Administered 2022-06-09: 75 mL via INTRAVENOUS

## 2022-06-09 NOTE — ED Provider Triage Note (Signed)
Emergency Medicine Provider Triage Evaluation Note  Maurice Gaines , a 46 y.o. male  was evaluated in triage.  Pt complains of LLQ abdominal pan.  Had 1 day of pain last week, subsided, but returned again last night and has been worsening.  Reprots some associated nausea/vomiting.  No fever.  Hx diverticulosis.  Review of Systems  Positive: Abdominal pain, N/V Negative: fever  Physical Exam  BP (!) 150/108 (BP Location: Right Arm)   Pulse 69   Temp 97.7 F (36.5 C)   Resp 18   Ht 5\' 7"  (1.702 m)   Wt 77.1 kg   SpO2 99%   BMI 26.63 kg/m  Gen:   Awake, no distress   Resp:  Normal effort  MSK:   Moves extremities without difficulty  Other:  Tender LLQ, voluntary guarding  Medical Decision Making  Medically screening exam initiated at 4:05 AM.  Appropriate orders placed.  Maurice Gaines was informed that the remainder of the evaluation will be completed by another provider, this initial triage assessment does not replace that evaluation, and the importance of remaining in the ED until their evaluation is complete.  LLQ abdominal pain.  VSS.  Does have hx diverticulosis.  Will get labs, CT.  PIV inserted in triage for scan.   Ezzie Dural, PA-C 06/09/22 [phone]

## 2022-06-09 NOTE — ED Notes (Signed)
Pt stated he feels like his stone has passed and is leaving

## 2022-06-09 NOTE — ED Triage Notes (Signed)
Pt arrived POV from home c/o LLQ abdominal pain that woke him out of his sleep that has gradually gotten worse. Pt also states now he has some N/V as well. Pt states he had this pain last Monday and it went away so he thought nothing of it.

## 2023-10-01 ENCOUNTER — Other Ambulatory Visit (HOSPITAL_BASED_OUTPATIENT_CLINIC_OR_DEPARTMENT_OTHER): Payer: Self-pay

## 2023-10-01 MED ORDER — OZEMPIC (1 MG/DOSE) 4 MG/3ML ~~LOC~~ SOPN
1.0000 mg | PEN_INJECTOR | SUBCUTANEOUS | 3 refills | Status: AC
  Filled 2023-10-01: qty 3, 28d supply, fill #0

## 2023-10-11 ENCOUNTER — Other Ambulatory Visit (HOSPITAL_BASED_OUTPATIENT_CLINIC_OR_DEPARTMENT_OTHER): Payer: Self-pay
